# Patient Record
Sex: Female | Born: 1940 | Smoking: Never smoker
Health system: Southern US, Community
[De-identification: ages and names within clinical notes are randomized; demographics above are authoritative.]

## PROBLEM LIST (undated history)

## (undated) DIAGNOSIS — F329 Major depressive disorder, single episode, unspecified: Secondary | ICD-10-CM

## (undated) DIAGNOSIS — F32A Depression, unspecified: Secondary | ICD-10-CM

## (undated) DIAGNOSIS — M1990 Unspecified osteoarthritis, unspecified site: Secondary | ICD-10-CM

## (undated) HISTORY — DX: Major depressive disorder, single episode, unspecified: F32.9

## (undated) HISTORY — DX: Unspecified osteoarthritis, unspecified site: M19.90

## (undated) HISTORY — DX: Depression, unspecified: F32.A

## (undated) HISTORY — PX: DILATION AND CURETTAGE OF UTERUS: SHX78

## (undated) HISTORY — PX: TONSILLECTOMY: SUR1361

---

## 2010-10-13 ENCOUNTER — Encounter (INDEPENDENT_AMBULATORY_CARE_PROVIDER_SITE_OTHER): Payer: Self-pay | Admitting: *Deleted

## 2010-10-23 NOTE — Letter (Signed)
Summary: New Patient letter  [facility]  [address] [place], [place] [address]   Phone: [phone]  Fax: [phone]       10/13/2010 MRN: [id number]  Claire Rodriguez [address] [place], [place]  [address]  Dear Ms. Aboud,  Welcome to the Gastroenterology Division at [facility].    You are scheduled to see Dr.  Christella Hartigan on 11-17-10 at 11:00A.M. on the 3rd floor at [facility], [address]  We ask that you try to arrive at our office 15 minutes prior to your appointment time to allow for check-in.  We would like you to complete the enclosed self-administered evaluation form prior to your visit and bring it with you on the day of your appointment.  We will review it with you.  Also, please bring a complete list of all your medications or, if you prefer, bring the medication bottles and we will list them.  Please bring your insurance card so that we may make a copy of it.  If your insurance requires a referral to see a specialist, please bring your referral form from your primary care physician.  Co-payments are due at the time of your visit and may be paid by cash, check or credit card.     Your office visit will consist of a consult with your physician (includes a physical exam), any laboratory testing he/she may order, scheduling of any necessary diagnostic testing (e.g. x-ray, ultrasound, CT-scan), and scheduling of a procedure (e.g. Endoscopy, Colonoscopy) if required.  Please allow enough time on your schedule to allow for any/all of these possibilities.    If you cannot keep your appointment, please call [phone] to cancel or reschedule prior to your appointment date.  This allows [place] the opportunity to schedule an appointment for another patient in need of care.  If you do not cancel or reschedule by 5 p.m. the business day prior to your appointment date, you will be charged a $50.00 late cancellation/no-show fee.    Thank you for choosing  [facility] for your medical needs.  We appreciate the opportunity to care for you.  Please visit [place] at our website  to learn more about our practice.                     Sincerely,                                                             The Gastroenterology Division

## 2010-11-11 ENCOUNTER — Telehealth (INDEPENDENT_AMBULATORY_CARE_PROVIDER_SITE_OTHER): Payer: Self-pay | Admitting: *Deleted

## 2010-11-17 ENCOUNTER — Other Ambulatory Visit: Payer: Medicare Other

## 2010-11-17 ENCOUNTER — Encounter: Payer: Self-pay | Admitting: Gastroenterology

## 2010-11-17 ENCOUNTER — Ambulatory Visit (INDEPENDENT_AMBULATORY_CARE_PROVIDER_SITE_OTHER): Payer: Medicare Other | Admitting: Gastroenterology

## 2010-11-17 ENCOUNTER — Encounter (INDEPENDENT_AMBULATORY_CARE_PROVIDER_SITE_OTHER): Payer: Self-pay | Admitting: *Deleted

## 2010-11-17 ENCOUNTER — Other Ambulatory Visit: Payer: Self-pay | Admitting: Gastroenterology

## 2010-11-17 DIAGNOSIS — R142 Eructation: Secondary | ICD-10-CM | POA: Insufficient documentation

## 2010-11-17 DIAGNOSIS — R197 Diarrhea, unspecified: Secondary | ICD-10-CM

## 2010-11-17 DIAGNOSIS — R141 Gas pain: Secondary | ICD-10-CM

## 2010-11-17 DIAGNOSIS — R143 Flatulence: Secondary | ICD-10-CM | POA: Insufficient documentation

## 2010-11-18 NOTE — Progress Notes (Signed)
Summary: [place] records  Phone Note Outgoing Call Call back at [facility] Phone [phone]   Call placed by: Chales Abrahams CMA Duncan Dull),  November 11, 2010 2:09 PM Summary of Call: placed a call to the pt she is to have records from [place] [facility] office faxed to [place] Initial call taken by: Chales Abrahams CMA Duncan Dull),  November 11, 2010 2:09 PM

## 2010-11-20 LAB — CONVERTED CEMR LAB: Tissue Transglutaminase Ab, IgA: 7.9 units (ref <20)

## 2010-11-25 NOTE — Letter (Signed)
Summary: [facility] Instructions  [facility]  [address] [place], [place] [address]   Phone: [phone]  Fax: [phone]       Claire Rodriguez    10-26-1940    MRN: [id number]        Procedure Day /Date:12/26/10 FRI     Arrival Time:10 am     Procedure Time:11 am     Location of Procedure:                    X  [facility] (4th Floor)                        PREPARATION FOR COLONOSCOPY WITH MOVIPREP   Starting 5 days prior to your procedure 12/21/10 do not eat nuts, seeds, popcorn, corn, beans, peas,  salads, or any raw vegetables.  Do not take any fiber supplements (e.g. Metamucil, Citrucel, and Benefiber).  THE DAY BEFORE YOUR PROCEDURE         DATE: 12/25/10 DAY: THURS  1.  Drink clear liquids the entire day-NO SOLID FOOD  2.  Do not drink anything colored red or purple.  Avoid juices with pulp.  No orange juice.  3.  Drink at least 64 oz. (8 glasses) of fluid/clear liquids during the day to prevent dehydration and help the prep work efficiently.  CLEAR LIQUIDS INCLUDE: Water Jello Ice Popsicles Tea (sugar ok, no milk/cream) Powdered fruit flavored drinks Coffee (sugar ok, no milk/cream) Gatorade Juice: apple, white grape, white cranberry  Lemonade Clear bullion, consomm, broth Carbonated beverages (any kind) Strained chicken noodle soup Hard Candy                             4.  In the morning, mix first dose of MoviPrep solution:    Empty 1 Pouch A and 1 Pouch B into the disposable container    Add lukewarm drinking water to the top line of the container. Mix to dissolve    Refrigerate (mixed solution should be used within 24 hrs)  5.  Begin drinking the prep at 5:00 p.m. The MoviPrep container is divided by 4 marks.   Every 15 minutes drink the solution down to the next mark (approximately 8 oz) until the full liter is complete.   6.  Follow completed prep with 16 oz of clear liquid of your choice (Nothing red or purple).   Continue to drink clear liquids until bedtime.  7.  Before going to bed, mix second dose of MoviPrep solution:    Empty 1 Pouch A and 1 Pouch B into the disposable container    Add lukewarm drinking water to the top line of the container. Mix to dissolve    Refrigerate  THE DAY OF YOUR PROCEDURE      DATE: 12/26/10 DAY:FRI  Beginning at 6 a.m. (5 hours before procedure):         1. Every 15 minutes, drink the solution down to the next mark (approx 8 oz) until the full liter is complete.  2. Follow completed prep with 16 oz. of clear liquid of your choice.    3. You may drink clear liquids until 9 am (2 HOURS BEFORE PROCEDURE).   MEDICATION INSTRUCTIONS  Unless otherwise instructed, you should take regular prescription medications with a small sip of water   as early as possible the morning of your procedure.  OTHER INSTRUCTIONS  You will need a responsible adult at least 69 years of age to accompany you and drive you home.   This person must remain in the waiting room during your procedure.  Wear loose fitting clothing that is easily removed.  Leave jewelry and other valuables at home.  However, you may wish to bring a book to read or  an iPod/MP3 player to listen to music as you wait for your procedure to start.  Remove all body piercing jewelry and leave at home.  Total time from sign-in until discharge is approximately 2-3 hours.  You should go home directly after your procedure and rest.  You can resume normal activities the  day after your procedure.  The day of your procedure you should not:   Drive   Make legal decisions   Operate machinery   Drink alcohol   Return to work  You will receive specific instructions about eating, activities and medications before you leave.    The above instructions have been reviewed and explained to me by   _______________________    I fully understand and can verbalize these instructions  _____________________________ Date _________

## 2010-11-25 NOTE — Assessment & Plan Note (Signed)
History of Present Illness Visit Type: Initial Consult Primary GI MD: Rob Bunting MD Primary Provider: Charlesetta Shanks, MD Requesting Provider: Charlesetta Shanks, MD Chief Complaint: Belching, gas pain& fecal incontinence History of Present Illness:        very pleasant 70 year old woman  who has UGI< lower GI symptoms for a long time.  urgency for 6-7 years.   She has rectal discomfort.Never sees rectal blood.  Never has nocturnal symptoms.  She has a BM up to 3 or more times a day.  Occasionally stool incontinence.  HAs a lot of gas, flatulence. Can be painful .  She has right leg, hip pains that are usually iimproved if she belches or passes gas.  was taking gas ex two times a day.  She recently moved here and the  gas symptoms are improving.  Urinating can cause a BM as well.  Just moved from [place] to be closer to family.   she had a colonoscopy January 2007 in [place], I reviewed the report and it was completely normal. She had this done for screening.   CBC, complete metabolic profile 1-2 months ago were completely normal.               Current Medications (verified): 1)  Buproban 150 Mg Xr12h-Tab (Bupropion Hcl (Smoking Deter)) .Marland Kitchen.. 1 By Mouth Once Daily 2)  Fluoxetine Hcl 20 Mg Caps (Fluoxetine Hcl) .Marland Kitchen.. 1 By Mouth Once Daily 3)  Tylenol Arthritis Pain 650 Mg Cr-Tabs (Acetaminophen) .... As Needed  Allergies (verified): 1)  ! Penicillin 2)  ! Codeine  Past History:  Past Medical History: Arthritis Depressio  Past Surgical History: Tonsillectomy    Family History:  no colon cancer  Social History:  she is divorced, she has one daughter, she is a retired Lawyer, she does not smoke cigarettes, she rarely drinks alcohol.  Review of Systems       Pertinent positive and negative review of systems were noted in the above HPI and GI specific review of systems.  All other review of systems was otherwise negative.   Vital Signs:  Patient  profile:   70 year old female Height:      67 inches Weight:      140.13 pounds BMI:     22.03 Pulse rate:   64 / minute Pulse rhythm:   regular BP sitting:   110 / 62  (left arm) Cuff size:   regular  Vitals Entered By: June McMurray CMA Duncan Dull) (November 17, 2010 11:00 AM)  Physical Exam  Additional Exam:  Constitutional: generally well appearing Psychiatric: alert and oriented times 3 Eyes: extraocular movements intact Mouth: oropharynx moist, no lesions Neck: supple, no lymphadenopathy Cardiovascular: heart regular rate and rythm Lungs: CTA bilaterally Abdomen: soft, non-tender, non-distended, no obvious ascites, no peritoneal signs, normal bowel sounds Extremities: no lower extremity edema bilaterally Skin: no lesions on visible extremities    Impression & Recommendations:  Problem # 1:   Urgency, intermittent fecal incontinence, gas discomforts  we should repeat colonoscopy, her last one was about 5 years ago and that was prior to all these changes in her bowel habits. she will begin taking Imodium one pill once daily and we'll get celiac sprue serologies tested.  Other Orders: T-Sprue Panel (Celiac Disease Aby Eval) (83516x3/[address]) TLB-IgA (Immunoglobulin A) (82784-IGA)  Patient Instructions: 1)  You will be scheduled to have a colonoscopy. 2)  Take one immodium every day shortly after waking up. 3)  A copy of this information  will be sent to Dr. Celene Skeen. 4)  You will get lab test(s) done today (celiac panel, total IgA level). 5)  The medication list was reviewed and reconciled.  All changed / newly prescribed medications were explained.  A complete medication list was provided to the patient / caregiver.  Appended Document: Orders Update/Movi    Clinical Lists Changes  Medications: Added new medication of MOVIPREP 100 GM  SOLR (PEG-KCL-NACL-NASULF-NA ASC-C) As per prep instructions. - Signed Rx of MOVIPREP 100 GM  SOLR (PEG-KCL-NACL-NASULF-NA ASC-C) As per  prep instructions.;  #1 x 0;  Signed;  Entered by: Chales Abrahams CMA (AAMA);  Authorized by: Rachael Fee MD;  Method used: Electronically to [id number] [address] [place], [place], [place]  [address], Ph: [phone], Fax: [phone] Orders: Added new Test order of Colonoscopy (Colon) - Signed    Prescriptions: MOVIPREP 100 GM  SOLR (PEG-KCL-NACL-NASULF-NA ASC-C) As per prep instructions.  #1 x 0   Entered by:   Chales Abrahams CMA (AAMA)   Authorized by:   Rachael Fee MD   Signed by:   Chales Abrahams CMA (AAMA) on 11/17/2010   Method used:   Electronically to        [id number] (retail)       [address] [place]       [place], [place]  [address]       Ph: [phone]       Fax: [phone]   RxID:   [id number]

## 2010-11-28 ENCOUNTER — Telehealth: Payer: Self-pay | Admitting: Gastroenterology

## 2010-11-28 NOTE — Telephone Encounter (Signed)
Pt had several questions regarding her procedures and the prep.  All her questions have been answered. She will call with any other concerns.  She is very anxious about the procedure and just needed to be reassured.

## 2010-12-05 ENCOUNTER — Telehealth: Payer: Self-pay | Admitting: Gastroenterology

## 2010-12-05 NOTE — Telephone Encounter (Addendum)
Pt has not tried the imodium Dr Christella Hartigan recommended not sure why he prescribed the medication because she does not have diarrhea.  I did explain that the pt was recommended to try the Imodium for stool incontinence.  She did remember then that is why he told her to take it.  She asked if she needed to take it if her symptoms have resolved.  I did advise her that she only needs to take it as needed.  Pt agreed

## 2010-12-26 ENCOUNTER — Encounter: Payer: Medicare Other | Admitting: Gastroenterology

## 2011-01-14 ENCOUNTER — Encounter: Payer: Medicare Other | Admitting: Gastroenterology

## 2011-01-23 ENCOUNTER — Encounter: Payer: Medicare Other | Admitting: Gastroenterology

## 2011-02-13 ENCOUNTER — Encounter: Payer: Medicare Other | Admitting: Gastroenterology

## 2011-03-04 ENCOUNTER — Encounter: Payer: Medicare Other | Admitting: Gastroenterology

## 2011-03-25 ENCOUNTER — Telehealth: Payer: Self-pay | Admitting: *Deleted

## 2011-03-25 ENCOUNTER — Telehealth: Payer: Self-pay | Admitting: Gastroenterology

## 2011-03-25 NOTE — Telephone Encounter (Signed)
Pt. Not available.

## 2011-03-27 NOTE — Telephone Encounter (Signed)
Left message on machine to call back  

## 2011-03-27 NOTE — Telephone Encounter (Signed)
Pt had questions regarding advance directives and the temperature that the Movi prep should be stored at.  I explained the advance directives to her and that the Movi prep is fine as long as she does not mix the solution until the morning before her procedure.  Pt agreed and will call back with any questions

## 2011-04-03 ENCOUNTER — Encounter: Payer: Medicare Other | Admitting: Gastroenterology

## 2011-08-05 ENCOUNTER — Encounter: Payer: Medicare Other | Admitting: Gastroenterology

## 2011-08-14 ENCOUNTER — Telehealth: Payer: Self-pay | Admitting: Gastroenterology

## 2011-08-17 NOTE — Telephone Encounter (Signed)
Left message on machine to call back  

## 2011-08-18 NOTE — Telephone Encounter (Signed)
Left message on machine to call back letter mailed. 

## 2011-08-20 ENCOUNTER — Telehealth: Payer: Self-pay | Admitting: Gastroenterology

## 2011-08-20 NOTE — Telephone Encounter (Signed)
Dr Christella Hartigan the pt is calling to reschedule her Endo Colon that she has cx many times in the past.  She says she was told that if she did not keep the  last  Appointment she could not reschedule.  I do not see that documentation in her chart.  She was last seen in March of this year,  Can she reschedule or do you want to see her in the office?

## 2011-08-21 NOTE — Telephone Encounter (Signed)
Pt was given 09/15/11 for a return visit to discuss endo colon she is aware

## 2011-08-21 NOTE — Telephone Encounter (Signed)
She needs rov before scheduling another time for colonoscopy

## 2011-09-15 ENCOUNTER — Encounter: Payer: Self-pay | Admitting: Gastroenterology

## 2011-09-15 ENCOUNTER — Ambulatory Visit (INDEPENDENT_AMBULATORY_CARE_PROVIDER_SITE_OTHER): Payer: Medicare Other | Admitting: Gastroenterology

## 2011-09-15 VITALS — BP 112/70 | HR 52 | Ht 67.0 in | Wt 145.0 lb

## 2011-09-15 DIAGNOSIS — R194 Change in bowel habit: Secondary | ICD-10-CM

## 2011-09-15 DIAGNOSIS — R1013 Epigastric pain: Secondary | ICD-10-CM

## 2011-09-15 DIAGNOSIS — R198 Other specified symptoms and signs involving the digestive system and abdomen: Secondary | ICD-10-CM

## 2011-09-15 MED ORDER — PEG-KCL-NACL-NASULF-NA ASC-C 100 G PO SOLR: 1.0000 | ORAL | Status: DC

## 2011-09-15 NOTE — Patient Instructions (Addendum)
You will be set up for a colonoscopy abnormal bowel habits. You will be set up for an upper endoscopy for ?celiac sprue. Please start taking citrucel (orange flavored) powder fiber supplement.  This may cause some bloating at first but that usually goes away. Begin with a small spoonful and work your way up to a large, heaping spoonful daily over a week.

## 2011-09-15 NOTE — Progress Notes (Signed)
HPI: This is a very pleasant 71 year old woman whom I last saw about 8 or 9 months ago.  She has belching, bloating, intermittent loose stools.  She is very concerned with upset stomach, usually has 3-4-5 bms (several pellets, not loose).   Has not tried fiber supplements in past.    TEXT FROM 3/12 office visit: "urgency for 6-7 years. She has rectal discomfort.Never sees rectal blood. Never has nocturnal symptoms. She has a BM up to 3 or more times a day. Occasionally stool incontinence.  HAs a lot of gas, flatulence. Can be painful . She has right leg, hip pains that are usually iimproved if she belches or passes gas. was taking gas ex two times a day. She recently moved here and the gas symptoms are improving.  Urinating can cause a BM as well.  Just moved from [place] to be closer to family.  she had a colonoscopy January 2007 in [place], I reviewed the report and it was completely normal. She had this done for screening.  CBC, complete metabolic profile 1-2 months ago were completely normal."  Celiac sprue serologies were drawn after his visits, one out of 3 of the tests was equivocal. She was recommended to have colonoscopy and upper endoscopy but canceled several times. She was also recommended to try Imodium on a daily basis.  Past Medical History  Diagnosis Date  . Arthritis   . Depression     Past Surgical History  Procedure Date  . Tonsillectomy   . Dilation and curettage of uterus     Current Outpatient Prescriptions  Medication Sig Dispense Refill  . buPROPion (WELLBUTRIN SR) 150 MG 12 hr tablet Take 150 mg by mouth 2 (two) times daily.        . Clobetasol Propionate (CLOBETASOL 17 PROPIONATE) 0.5 % POWD by Does not apply route.        Marland Kitchen desonide (DESOWEN) 0.05 % cream Apply topically 2 (two) times daily.        Marland Kitchen FLUoxetine (PROZAC) 20 MG capsule Take 20 mg by mouth daily.          Allergies as of 09/15/2011 - Review Complete 09/15/2011  Allergen Reaction  Noted  . Codeine    . Penicillins      Family History  Problem Relation Age of Onset  . Colon cancer Neg Hx     History   Social History  . Marital Status: Unknown    Spouse Name: N/A    Number of Children: 1  . Years of Education: N/A   Occupational History  . retired     Lawyer   Social History Main Topics  . Smoking status: Never Smoker   . Smokeless tobacco: Never Used  . Alcohol Use: No  . Drug Use: No  . Sexually Active: Not on file   Other Topics Concern  . Not on file   Social History Narrative  . No narrative on file      Physical Exam: BP 112/70  Pulse 52  Ht 5\' 7"  (1.702 m)  Wt 145 lb (65.772 kg)  BMI 22.71 kg/m2  SpO2 99% Constitutional: generally well-appearing Psychiatric: alert and oriented x3 Abdomen: soft, nontender, nondistended, no obvious ascites, no peritoneal signs, normal bowel sounds     Assessment and plan: 71 y.o. female with equivocal celiac sprue blood testing, change in her bowel habits since her last colonoscopy 6 years ago  We will proceed with colonoscopy and upper endoscopy at her since  convenience. I recommended she try fiber supplement on a daily basis to help with her scibbolous type stools

## 2011-09-22 ENCOUNTER — Telehealth: Payer: Self-pay | Admitting: Gastroenterology

## 2011-09-22 NOTE — Telephone Encounter (Signed)
I answered all her questions and she wants to proceed with both colonoscopy and upper endoscopy.

## 2011-09-22 NOTE — Telephone Encounter (Signed)
Pt has questions regarding her EGD she has read some info and is worried about complications.  I explained the procedure and answered her questions, she would still like for Dr Christella Hartigan to call her so that she may discuss it further.

## 2011-09-29 ENCOUNTER — Ambulatory Visit (AMBULATORY_SURGERY_CENTER): Payer: Medicare Other | Admitting: Gastroenterology

## 2011-09-29 ENCOUNTER — Encounter: Payer: Self-pay | Admitting: Gastroenterology

## 2011-09-29 DIAGNOSIS — R198 Other specified symptoms and signs involving the digestive system and abdomen: Secondary | ICD-10-CM

## 2011-09-29 DIAGNOSIS — K3189 Other diseases of stomach and duodenum: Secondary | ICD-10-CM

## 2011-09-29 DIAGNOSIS — D126 Benign neoplasm of colon, unspecified: Secondary | ICD-10-CM

## 2011-09-29 MED ORDER — SODIUM CHLORIDE 0.9 % IV SOLN: 500.0000 mL | INTRAVENOUS | Status: DC

## 2011-09-29 NOTE — Op Note (Signed)
[facility] [address] [place], [place]  [address]  ENDOSCOPY PROCEDURE REPORT  PATIENT:  Claire Rodriguez, Claire Rodriguez  MR#:  [id number] BIRTHDATE:  [birth year], 70 yrs. old  GENDER:  female ENDOSCOPIST:  Rachael Fee, MD Referred by:  Tracey Harries, M.D. PROCEDURE DATE:  09/29/2011 PROCEDURE:  EGD with biopsy, 43239 ASA CLASS:  Class II INDICATIONS:  dyspepsia, one out of three celiac sprue serologies was slightly elevated MEDICATIONS:  There was residual sedation effect present from prior procedure., These medications were titrated to patient response per physician's verbal order, Versed 1 mg IV TOPICAL ANESTHETIC:  Cetacaine Spray  DESCRIPTION OF PROCEDURE:   After the risks benefits and alternatives of the procedure were thoroughly explained, informed consent was obtained.  The LB [id number]-H180 [id number] endoscope was introduced through the mouth and advanced to the second portion of the duodenum, without limitations.  The instrument was slowly withdrawn as the mucosa was fully examined. <<PROCEDUREIMAGES>> The upper, middle, and distal third of the esophagus were carefully inspected and no abnormalities were noted. The z-line was well seen at the GEJ. The endoscope was pushed into the fundus which was normal including a retroflexed view. The antrum,gastric body, first and second part of the duodenum were unremarkable. Biopsies were taken from normal duodenum and sent to pathology (see image1, image2, image3, and image4).    Retroflexed views revealed no abnormalities.    The scope was then withdrawn from the patient and the procedure completed. COMPLICATIONS:  None  ENDOSCOPIC IMPRESSION: 1) Normal EGD, duodenum biopsied to check for Celiac Sprue  RECOMMENDATIONS: 1) Await biopsy results  ______________________________ Rachael Fee, MD  n. eSIGNED:   Rachael Fee at 09/29/2011 02:15 PM  Candee Furbish, [id number]

## 2011-09-29 NOTE — Patient Instructions (Signed)
Discharge instructions given with verbal understanding. Handout on polyps given. Resume previous medications. 

## 2011-09-29 NOTE — Op Note (Signed)
[facility] [address] [place], [place]  [address]  COLONOSCOPY PROCEDURE REPORT  PATIENT:  Claire Rodriguez, Claire Rodriguez  MR#:  [id number] BIRTHDATE:  04/02/1941, 70 yrs. old  GENDER:  female ENDOSCOPIST:  Rachael Fee, MD REF. BY:  Tracey Harries, M.D. PROCEDURE DATE:  09/29/2011 PROCEDURE:  Colonoscopy with biopsy ASA CLASS:  Class II INDICATIONS:  change in bowel habits, last colonoscopy 6-7 years ago MEDICATIONS:   Fentanyl 50 mcg IV, These medications were titrated to patient response per physician's verbal order, Versed 5 mg IV  DESCRIPTION OF PROCEDURE:   After the risks benefits and alternatives of the procedure were thoroughly explained, informed consent was obtained.  Digital rectal exam was performed and revealed no rectal masses.   The LB [id number] endoscope was introduced through the anus and advanced to the cecum, which was identified by both the appendix and ileocecal valve, without limitations.  The quality of the prep was good..  The instrument was then slowly withdrawn as the colon was fully examined. <<PROCEDUREIMAGES>> FINDINGS:  A diminutive polyp was found in the sigmoid colon. This was removed with forceps and sent to pathology (jar 1) (see image3 and image5).  This was otherwise a normal examination of the colon (see image6, image1, and image2).   Retroflexed views in the rectum revealed no abnormalities. COMPLICATIONS:  None  ENDOSCOPIC IMPRESSION: 1) Diminutive polyp in the sigmoid colon, removed and sent to pathology 2) Otherwise normal examination  RECOMMENDATIONS: 1) If the polyp(s) removed today are proven to be adenomatous (pre-cancerous) polyps, you will need a repeat colonoscopy in 5 years. Otherwise you should continue to follow colorectal cancer screening guidelines for "routine risk" patients with colonoscopy in 10 years.  You will get a letter in the mail in 2-3 weeks. 2) Please start once daily fiber supplements as we  discussed at office visit earlier this month.  ______________________________ Rachael Fee, MD  n. eSIGNED:   Rachael Fee at 09/29/2011 02:07 PM  Candee Furbish, [phone]

## 2011-09-29 NOTE — Progress Notes (Signed)
Patient did not experience any of the following events: a burn prior to discharge; a fall within the facility; wrong site/side/patient/procedure/implant event; or a hospital transfer or hospital admission upon discharge from the facility. (G8907) Patient did not have preoperative order for IV antibiotic SSI prophylaxis. (G8918)  

## 2011-09-30 ENCOUNTER — Telehealth: Payer: Self-pay | Admitting: *Deleted

## 2011-09-30 NOTE — Telephone Encounter (Signed)

## 2011-10-01 ENCOUNTER — Telehealth: Payer: Self-pay | Admitting: Gastroenterology

## 2011-10-02 NOTE — Telephone Encounter (Signed)
Pt wants to try the store brand of the citrucel because of cost issues.  I did advise that it was ok to use store brand. She will call if she has further questions or concerns

## 2011-10-06 ENCOUNTER — Encounter: Payer: Self-pay | Admitting: Gastroenterology

## 2011-10-06 ENCOUNTER — Telehealth: Payer: Self-pay | Admitting: Gastroenterology

## 2011-10-06 NOTE — Telephone Encounter (Signed)
i agree, thanks 

## 2011-10-06 NOTE — Telephone Encounter (Signed)
Pt has been having gas and bloating and right side abd pain she has not started the citrucel as advised.  She says she is starting the Citrucel today.  The gas and bloating are worse after the colonoscopy.  She is passing gas and having bowel movments, the stools are small and she goes 3-4 times per day.  This is nothing new for her.

## 2013-09-20 DIAGNOSIS — L219 Seborrheic dermatitis, unspecified: Secondary | ICD-10-CM | POA: Diagnosis not present

## 2013-09-30 DIAGNOSIS — B029 Zoster without complications: Secondary | ICD-10-CM | POA: Diagnosis not present

## 2013-10-09 DIAGNOSIS — B029 Zoster without complications: Secondary | ICD-10-CM | POA: Diagnosis not present

## 2013-10-09 DIAGNOSIS — Z87891 Personal history of nicotine dependence: Secondary | ICD-10-CM | POA: Diagnosis not present

## 2013-10-09 DIAGNOSIS — IMO0002 Reserved for concepts with insufficient information to code with codable children: Secondary | ICD-10-CM | POA: Diagnosis not present

## 2013-10-20 DIAGNOSIS — B029 Zoster without complications: Secondary | ICD-10-CM | POA: Diagnosis not present

## 2013-10-20 DIAGNOSIS — H579 Unspecified disorder of eye and adnexa: Secondary | ICD-10-CM | POA: Diagnosis not present

## 2013-10-20 DIAGNOSIS — Z87891 Personal history of nicotine dependence: Secondary | ICD-10-CM | POA: Diagnosis not present

## 2013-10-20 DIAGNOSIS — IMO0002 Reserved for concepts with insufficient information to code with codable children: Secondary | ICD-10-CM | POA: Diagnosis not present

## 2014-01-22 DIAGNOSIS — Z780 Asymptomatic menopausal state: Secondary | ICD-10-CM | POA: Diagnosis not present

## 2014-01-22 DIAGNOSIS — Z0000 Encounter for general adult medical examination without abnormal findings: Secondary | ICD-10-CM | POA: Diagnosis not present

## 2014-01-22 DIAGNOSIS — Z1231 Encounter for screening mammogram for malignant neoplasm of breast: Secondary | ICD-10-CM | POA: Diagnosis not present

## 2014-03-28 DIAGNOSIS — M25559 Pain in unspecified hip: Secondary | ICD-10-CM | POA: Diagnosis not present

## 2014-03-28 DIAGNOSIS — R635 Abnormal weight gain: Secondary | ICD-10-CM | POA: Diagnosis not present

## 2014-03-28 DIAGNOSIS — M2550 Pain in unspecified joint: Secondary | ICD-10-CM | POA: Diagnosis not present

## 2014-03-28 DIAGNOSIS — M21619 Bunion of unspecified foot: Secondary | ICD-10-CM | POA: Diagnosis not present

## 2014-04-02 DIAGNOSIS — M25559 Pain in unspecified hip: Secondary | ICD-10-CM | POA: Diagnosis not present

## 2014-05-09 DIAGNOSIS — M2550 Pain in unspecified joint: Secondary | ICD-10-CM | POA: Diagnosis not present

## 2014-05-09 DIAGNOSIS — L821 Other seborrheic keratosis: Secondary | ICD-10-CM | POA: Diagnosis not present

## 2014-05-09 DIAGNOSIS — L219 Seborrheic dermatitis, unspecified: Secondary | ICD-10-CM | POA: Diagnosis not present

## 2014-05-23 DIAGNOSIS — L299 Pruritus, unspecified: Secondary | ICD-10-CM | POA: Diagnosis not present

## 2014-05-23 DIAGNOSIS — L219 Seborrheic dermatitis, unspecified: Secondary | ICD-10-CM | POA: Diagnosis not present

## 2014-05-24 DIAGNOSIS — L290 Pruritus ani: Secondary | ICD-10-CM | POA: Diagnosis not present

## 2014-06-15 DIAGNOSIS — M2042 Other hammer toe(s) (acquired), left foot: Secondary | ICD-10-CM | POA: Diagnosis not present

## 2014-06-15 DIAGNOSIS — M2041 Other hammer toe(s) (acquired), right foot: Secondary | ICD-10-CM | POA: Diagnosis not present

## 2014-06-15 DIAGNOSIS — M2012 Hallux valgus (acquired), left foot: Secondary | ICD-10-CM | POA: Diagnosis not present

## 2014-06-15 DIAGNOSIS — L84 Corns and callosities: Secondary | ICD-10-CM | POA: Diagnosis not present

## 2014-06-15 DIAGNOSIS — M2011 Hallux valgus (acquired), right foot: Secondary | ICD-10-CM | POA: Diagnosis not present

## 2014-06-15 DIAGNOSIS — M722 Plantar fascial fibromatosis: Secondary | ICD-10-CM | POA: Diagnosis not present

## 2014-07-02 DIAGNOSIS — F330 Major depressive disorder, recurrent, mild: Secondary | ICD-10-CM | POA: Diagnosis not present

## 2014-07-02 DIAGNOSIS — Z23 Encounter for immunization: Secondary | ICD-10-CM | POA: Diagnosis not present

## 2014-07-17 DIAGNOSIS — M722 Plantar fascial fibromatosis: Secondary | ICD-10-CM | POA: Diagnosis not present

## 2014-07-17 DIAGNOSIS — M2011 Hallux valgus (acquired), right foot: Secondary | ICD-10-CM | POA: Diagnosis not present

## 2014-08-20 DIAGNOSIS — L218 Other seborrheic dermatitis: Secondary | ICD-10-CM | POA: Diagnosis not present

## 2014-08-20 DIAGNOSIS — L299 Pruritus, unspecified: Secondary | ICD-10-CM | POA: Diagnosis not present

## 2014-08-20 DIAGNOSIS — L219 Seborrheic dermatitis, unspecified: Secondary | ICD-10-CM | POA: Diagnosis not present

## 2014-08-21 DIAGNOSIS — E039 Hypothyroidism, unspecified: Secondary | ICD-10-CM | POA: Diagnosis not present

## 2014-08-21 DIAGNOSIS — E162 Hypoglycemia, unspecified: Secondary | ICD-10-CM | POA: Diagnosis not present

## 2014-08-21 DIAGNOSIS — L680 Hirsutism: Secondary | ICD-10-CM | POA: Diagnosis not present

## 2014-08-22 ENCOUNTER — Other Ambulatory Visit (HOSPITAL_COMMUNITY): Payer: Self-pay | Admitting: Internal Medicine

## 2014-08-22 DIAGNOSIS — R42 Dizziness and giddiness: Secondary | ICD-10-CM | POA: Diagnosis not present

## 2014-08-22 DIAGNOSIS — Z0000 Encounter for general adult medical examination without abnormal findings: Secondary | ICD-10-CM | POA: Diagnosis not present

## 2014-08-22 DIAGNOSIS — H8309 Labyrinthitis, unspecified ear: Secondary | ICD-10-CM | POA: Diagnosis not present

## 2014-08-22 DIAGNOSIS — R1032 Left lower quadrant pain: Secondary | ICD-10-CM | POA: Diagnosis not present

## 2014-08-22 DIAGNOSIS — H9113 Presbycusis, bilateral: Secondary | ICD-10-CM | POA: Diagnosis not present

## 2014-08-22 DIAGNOSIS — Z1389 Encounter for screening for other disorder: Secondary | ICD-10-CM | POA: Diagnosis not present

## 2014-08-22 DIAGNOSIS — R002 Palpitations: Secondary | ICD-10-CM | POA: Diagnosis not present

## 2014-08-22 DIAGNOSIS — E780 Pure hypercholesterolemia: Secondary | ICD-10-CM | POA: Diagnosis not present

## 2014-08-22 DIAGNOSIS — I70219 Atherosclerosis of native arteries of extremities with intermittent claudication, unspecified extremity: Secondary | ICD-10-CM | POA: Diagnosis not present

## 2014-08-22 DIAGNOSIS — R0989 Other specified symptoms and signs involving the circulatory and respiratory systems: Secondary | ICD-10-CM

## 2014-08-27 ENCOUNTER — Ambulatory Visit (HOSPITAL_COMMUNITY)
Admission: RE | Admit: 2014-08-27 | Discharge: 2014-08-27 | Disposition: A | Discharge status: Home / Self Care | Payer: Medicare Other | Source: Ambulatory Visit | Attending: Internal Medicine | Admitting: Internal Medicine

## 2014-08-27 DIAGNOSIS — R0989 Other specified symptoms and signs involving the circulatory and respiratory systems: Secondary | ICD-10-CM

## 2014-08-27 DIAGNOSIS — R5383 Other fatigue: Secondary | ICD-10-CM | POA: Diagnosis not present

## 2014-08-27 DIAGNOSIS — Z79899 Other long term (current) drug therapy: Secondary | ICD-10-CM | POA: Diagnosis not present

## 2014-08-27 DIAGNOSIS — R412 Retrograde amnesia: Secondary | ICD-10-CM | POA: Diagnosis not present

## 2014-08-27 DIAGNOSIS — E780 Pure hypercholesterolemia: Secondary | ICD-10-CM | POA: Diagnosis not present

## 2014-08-27 DIAGNOSIS — I6523 Occlusion and stenosis of bilateral carotid arteries: Secondary | ICD-10-CM | POA: Insufficient documentation

## 2014-08-27 NOTE — Progress Notes (Signed)
VASCULAR LAB PRELIMINARY  PRELIMINARY  PRELIMINARY  PRELIMINARY  Carotid Dopplers completed.    Preliminary report:  1-39% ICA stenosis.  Vertebral artery flow is antegrade.  Alberto Schoch, RVT 08/27/2014, 12:01 PM

## 2014-10-29 ENCOUNTER — Encounter: Payer: Self-pay | Admitting: Internal Medicine

## 2014-11-16 DIAGNOSIS — E039 Hypothyroidism, unspecified: Secondary | ICD-10-CM | POA: Diagnosis not present

## 2014-11-16 DIAGNOSIS — L680 Hirsutism: Secondary | ICD-10-CM | POA: Diagnosis not present

## 2014-12-05 DIAGNOSIS — I10 Essential (primary) hypertension: Secondary | ICD-10-CM | POA: Diagnosis not present

## 2014-12-06 DIAGNOSIS — L680 Hirsutism: Secondary | ICD-10-CM | POA: Diagnosis not present

## 2014-12-06 DIAGNOSIS — R7301 Impaired fasting glucose: Secondary | ICD-10-CM | POA: Diagnosis not present

## 2015-02-19 DIAGNOSIS — L219 Seborrheic dermatitis, unspecified: Secondary | ICD-10-CM | POA: Diagnosis not present

## 2015-02-19 DIAGNOSIS — R413 Other amnesia: Secondary | ICD-10-CM | POA: Diagnosis not present

## 2015-02-19 DIAGNOSIS — E559 Vitamin D deficiency, unspecified: Secondary | ICD-10-CM | POA: Diagnosis not present

## 2015-02-19 DIAGNOSIS — E119 Type 2 diabetes mellitus without complications: Secondary | ICD-10-CM | POA: Diagnosis not present

## 2015-02-19 DIAGNOSIS — M1990 Unspecified osteoarthritis, unspecified site: Secondary | ICD-10-CM | POA: Diagnosis not present

## 2015-02-19 DIAGNOSIS — R0989 Other specified symptoms and signs involving the circulatory and respiratory systems: Secondary | ICD-10-CM | POA: Diagnosis not present

## 2015-02-19 DIAGNOSIS — M545 Low back pain: Secondary | ICD-10-CM | POA: Diagnosis not present

## 2015-02-19 DIAGNOSIS — F322 Major depressive disorder, single episode, severe without psychotic features: Secondary | ICD-10-CM | POA: Diagnosis not present

## 2015-02-19 DIAGNOSIS — Z79899 Other long term (current) drug therapy: Secondary | ICD-10-CM | POA: Diagnosis not present

## 2015-02-20 DIAGNOSIS — L659 Nonscarring hair loss, unspecified: Secondary | ICD-10-CM | POA: Diagnosis not present

## 2015-02-20 DIAGNOSIS — L299 Pruritus, unspecified: Secondary | ICD-10-CM | POA: Diagnosis not present

## 2015-02-20 DIAGNOSIS — L650 Telogen effluvium: Secondary | ICD-10-CM | POA: Diagnosis not present

## 2015-02-20 DIAGNOSIS — L219 Seborrheic dermatitis, unspecified: Secondary | ICD-10-CM | POA: Diagnosis not present

## 2015-02-26 ENCOUNTER — Other Ambulatory Visit: Payer: Self-pay | Admitting: Family Medicine

## 2015-02-26 DIAGNOSIS — R0989 Other specified symptoms and signs involving the circulatory and respiratory systems: Secondary | ICD-10-CM

## 2015-03-22 DIAGNOSIS — H04129 Dry eye syndrome of unspecified lacrimal gland: Secondary | ICD-10-CM | POA: Diagnosis not present

## 2015-03-22 DIAGNOSIS — B009 Herpesviral infection, unspecified: Secondary | ICD-10-CM | POA: Diagnosis not present

## 2015-03-22 DIAGNOSIS — I6523 Occlusion and stenosis of bilateral carotid arteries: Secondary | ICD-10-CM | POA: Diagnosis not present

## 2015-04-01 DIAGNOSIS — H1013 Acute atopic conjunctivitis, bilateral: Secondary | ICD-10-CM | POA: Diagnosis not present

## 2015-05-24 DIAGNOSIS — Z79899 Other long term (current) drug therapy: Secondary | ICD-10-CM | POA: Diagnosis not present

## 2015-05-24 DIAGNOSIS — F322 Major depressive disorder, single episode, severe without psychotic features: Secondary | ICD-10-CM | POA: Diagnosis not present

## 2015-05-28 DIAGNOSIS — Z79899 Other long term (current) drug therapy: Secondary | ICD-10-CM | POA: Diagnosis not present

## 2015-07-09 DIAGNOSIS — Z79899 Other long term (current) drug therapy: Secondary | ICD-10-CM | POA: Diagnosis not present

## 2015-07-09 DIAGNOSIS — R55 Syncope and collapse: Secondary | ICD-10-CM | POA: Diagnosis not present

## 2015-07-09 DIAGNOSIS — I10 Essential (primary) hypertension: Secondary | ICD-10-CM | POA: Diagnosis not present

## 2015-07-12 ENCOUNTER — Other Ambulatory Visit: Payer: Self-pay | Admitting: Family Medicine

## 2015-07-12 DIAGNOSIS — R55 Syncope and collapse: Secondary | ICD-10-CM

## 2015-07-16 DIAGNOSIS — R55 Syncope and collapse: Secondary | ICD-10-CM | POA: Insufficient documentation

## 2015-07-17 ENCOUNTER — Ambulatory Visit
Admission: RE | Admit: 2015-07-17 | Discharge: 2015-07-17 | Disposition: A | Discharge status: Home / Self Care | Payer: Medicare Other | Source: Ambulatory Visit | Attending: Family Medicine | Admitting: Family Medicine

## 2015-07-17 ENCOUNTER — Ambulatory Visit (INDEPENDENT_AMBULATORY_CARE_PROVIDER_SITE_OTHER): Payer: Medicare Other

## 2015-07-17 DIAGNOSIS — R55 Syncope and collapse: Secondary | ICD-10-CM

## 2015-07-17 DIAGNOSIS — R51 Headache: Secondary | ICD-10-CM | POA: Diagnosis not present

## 2015-07-17 DIAGNOSIS — H532 Diplopia: Secondary | ICD-10-CM | POA: Diagnosis not present

## 2015-07-17 MED ORDER — GADOBENATE DIMEGLUMINE 529 MG/ML IV SOLN
13.0000 mL | Freq: Once | INTRAVENOUS | Status: AC | PRN
  Administered 2015-07-17: 13 mL via INTRAVENOUS

## 2015-07-22 ENCOUNTER — Other Ambulatory Visit: Payer: Medicare Other

## 2015-07-23 ENCOUNTER — Ambulatory Visit (INDEPENDENT_AMBULATORY_CARE_PROVIDER_SITE_OTHER): Payer: Medicare Other | Admitting: Neurology

## 2015-07-23 ENCOUNTER — Encounter: Payer: Self-pay | Admitting: Neurology

## 2015-07-23 VITALS — BP 134/70 | HR 61 | Resp 16 | Ht 66.5 in | Wt 154.0 lb

## 2015-07-23 DIAGNOSIS — R404 Transient alteration of awareness: Secondary | ICD-10-CM | POA: Diagnosis not present

## 2015-07-23 DIAGNOSIS — R413 Other amnesia: Secondary | ICD-10-CM

## 2015-07-23 NOTE — Patient Instructions (Addendum)
1. Schedule routine EEG, then 24-hour EEG 2. Start daily aspirin 81mg  3. We will obtain bloodwork from Dr. Stephanie Acre office, and order additional bloodwork if needed 4. As per [place] driving laws, after an episode of loss of awareness, one should not drive until 6 months event-free 5. Follow-up in 1 month
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
## 2015-07-28 DIAGNOSIS — R413 Other amnesia: Secondary | ICD-10-CM | POA: Insufficient documentation

## 2015-07-28 DIAGNOSIS — R404 Transient alteration of awareness: Secondary | ICD-10-CM | POA: Insufficient documentation

## 2015-07-29 ENCOUNTER — Telehealth: Payer: Self-pay | Admitting: Family Medicine

## 2015-07-29 DIAGNOSIS — R413 Other amnesia: Secondary | ICD-10-CM

## 2015-07-29 NOTE — Telephone Encounter (Signed)
-----   Message from Cameron Sprang, MD sent at 07/28/2015  9:49 AM EST ----- Regarding: pls order B12 Pls let her know labs from Dr. Stephanie Acre reviewed, TSH normal. Would do B12 level as part of memory loss workup. Thanks

## 2015-07-29 NOTE — Telephone Encounter (Signed)
Lmovm to rtn my call. 

## 2015-07-30 NOTE — Telephone Encounter (Signed)
Pt is returning your call

## 2015-07-30 NOTE — Telephone Encounter (Signed)
Patient returned my call. Made her aware of advisement. She is going out of town tomorrow until 12/12. She does have an appt for an eeg on 12/14 she states she will come in a little earlier before her appt to have that lab drawn.

## 2015-08-21 ENCOUNTER — Ambulatory Visit (INDEPENDENT_AMBULATORY_CARE_PROVIDER_SITE_OTHER): Payer: Medicare Other | Admitting: Neurology

## 2015-08-21 ENCOUNTER — Other Ambulatory Visit: Payer: Medicare Other

## 2015-08-21 DIAGNOSIS — R413 Other amnesia: Secondary | ICD-10-CM

## 2015-08-21 DIAGNOSIS — R404 Transient alteration of awareness: Secondary | ICD-10-CM

## 2015-08-21 LAB — VITAMIN B12: Vitamin B-12: >1500 pg/mL — ABNORMAL HIGH (ref 211–911)

## 2015-08-22 ENCOUNTER — Telehealth: Payer: Self-pay | Admitting: Family Medicine

## 2015-08-22 NOTE — Telephone Encounter (Signed)
Left msg on vm to rtn my call.

## 2015-08-22 NOTE — Telephone Encounter (Signed)
Caryl Pina let her know that her b-12 level was good

## 2015-08-22 NOTE — Telephone Encounter (Signed)
-----   Message from Cameron Sprang, MD sent at 08/22/2015  9:44 AM EST ----- Pls let her know B12 level is good. Thanks
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
## 2015-08-26 ENCOUNTER — Telehealth: Payer: Self-pay | Admitting: Family Medicine

## 2015-08-26 NOTE — Telephone Encounter (Signed)
Patient returned my call. Notified her of result. She is scheduled for a 24 hour eeg in January.

## 2015-08-26 NOTE — Telephone Encounter (Signed)
Lmovm to rtn my call. 

## 2015-08-26 NOTE — Telephone Encounter (Signed)
-----   Message from Cameron Sprang, MD sent at 08/23/2015  2:20 PM EST ----- Please let patient know routine EEG is normal, would proceed with 24-hour EEG as discussed. Thanks

## 2015-08-27 ENCOUNTER — Ambulatory Visit: Payer: Medicare Other | Admitting: Neurology

## 2015-09-25 ENCOUNTER — Ambulatory Visit (INDEPENDENT_AMBULATORY_CARE_PROVIDER_SITE_OTHER): Payer: Medicare Other | Admitting: Neurology

## 2015-09-25 DIAGNOSIS — R413 Other amnesia: Secondary | ICD-10-CM | POA: Diagnosis not present

## 2015-09-25 DIAGNOSIS — G40909 Epilepsy, unspecified, not intractable, without status epilepticus: Secondary | ICD-10-CM

## 2015-09-25 DIAGNOSIS — R404 Transient alteration of awareness: Secondary | ICD-10-CM

## 2015-09-26 ENCOUNTER — Encounter: Payer: Self-pay | Admitting: Neurology

## 2015-09-26 NOTE — Progress Notes (Unsigned)
While here for removal of EEG electrodes for 24 hour ambulatory EEG some collodion removal dripped down her forehead and she wiped it and touched her left eye. As soon as she reported this to me I had her stand up turn around and start flushing her eye. She flushed her eye with water for several minutes. I finished removing the electrodes and asked her if she wanted to go to the emergency room and she did not want to inconvenience her ride. She said her eye felt a little irritated but not burning like when she realized some Collodion remover was in it. She has artificial tears and was going to put some in her eye when she got home. Her eye did appear a little irritated pink, not bloodshot. She asked if she could go to the emergency room later if needed I told her yes by all means and that I would place this note in her chart as well as fill out a safety report on the portal which I did before writing this note.

## 2015-10-02 ENCOUNTER — Telehealth: Payer: Self-pay | Admitting: Neurology

## 2015-10-02 NOTE — Telephone Encounter (Signed)
PT called and wanted to know if the EEG results are ready yetDawn CB# [phone]

## 2015-10-02 NOTE — Telephone Encounter (Signed)
Please review
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
## 2015-10-04 ENCOUNTER — Ambulatory Visit (INDEPENDENT_AMBULATORY_CARE_PROVIDER_SITE_OTHER): Payer: Medicare Other | Admitting: Neurology

## 2015-10-04 ENCOUNTER — Encounter: Payer: Self-pay | Admitting: Neurology

## 2015-10-04 VITALS — BP 130/64 | HR 66 | Ht 66.5 in | Wt 158.0 lb

## 2015-10-04 DIAGNOSIS — R413 Other amnesia: Secondary | ICD-10-CM
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
## 2015-10-04 NOTE — Telephone Encounter (Signed)
See clinic visit note
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
## 2015-11-26 ENCOUNTER — Telehealth: Payer: Self-pay | Admitting: Neurology

## 2015-11-26 NOTE — Telephone Encounter (Signed)
Returned call. Patient wanted to make sure her next appt date was correct. She is scheduled for her 3 month f/u on 5/2. The March 28th appt was cancelled because she was seen at the end of Jan.

## 2015-11-26 NOTE — Telephone Encounter (Signed)
Patient called today about 2 appointments that she has written down. We has her scheduled for 3/28 which was cancelled and another appointment scheduled for 5/2. She was unsure why the 3/28 was cancelled. She goes tomorrow 3/23 for testing at [facility]. She would like you to call her back please. Her name is Claire Rodriguez June 27, 2041. Her number is  [phone]. Thank you

## 2015-11-27 DIAGNOSIS — R413 Other amnesia: Secondary | ICD-10-CM | POA: Diagnosis not present

## 2015-11-29 ENCOUNTER — Other Ambulatory Visit (HOSPITAL_BASED_OUTPATIENT_CLINIC_OR_DEPARTMENT_OTHER): Payer: Self-pay | Admitting: Family Medicine

## 2015-11-29 DIAGNOSIS — Z1231 Encounter for screening mammogram for malignant neoplasm of breast: Secondary | ICD-10-CM

## 2015-12-02 ENCOUNTER — Ambulatory Visit (HOSPITAL_BASED_OUTPATIENT_CLINIC_OR_DEPARTMENT_OTHER): Payer: Medicare Other

## 2015-12-03 ENCOUNTER — Ambulatory Visit: Payer: Medicare Other | Admitting: Neurology

## 2015-12-10 DIAGNOSIS — I10 Essential (primary) hypertension: Secondary | ICD-10-CM | POA: Diagnosis not present

## 2015-12-10 DIAGNOSIS — R609 Edema, unspecified: Secondary | ICD-10-CM | POA: Diagnosis not present

## 2015-12-10 DIAGNOSIS — N644 Mastodynia: Secondary | ICD-10-CM | POA: Diagnosis not present

## 2015-12-10 DIAGNOSIS — R002 Palpitations: Secondary | ICD-10-CM | POA: Diagnosis not present

## 2015-12-17 DIAGNOSIS — N644 Mastodynia: Secondary | ICD-10-CM | POA: Diagnosis not present

## 2015-12-25 ENCOUNTER — Ambulatory Visit (INDEPENDENT_AMBULATORY_CARE_PROVIDER_SITE_OTHER): Payer: Medicare Other | Admitting: Neurology

## 2015-12-25 ENCOUNTER — Encounter: Payer: Self-pay | Admitting: Neurology

## 2015-12-25 VITALS — BP 126/60 | HR 68 | Ht 66.5 in | Wt 156.0 lb

## 2015-12-25 DIAGNOSIS — F32A Depression, unspecified: Secondary | ICD-10-CM

## 2015-12-25 DIAGNOSIS — F329 Major depressive disorder, single episode, unspecified: Secondary | ICD-10-CM | POA: Diagnosis not present

## 2015-12-25 DIAGNOSIS — F411 Generalized anxiety disorder: Secondary | ICD-10-CM

## 2015-12-25 DIAGNOSIS — R413 Other amnesia: Secondary | ICD-10-CM | POA: Diagnosis not present
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
## 2015-12-31 DIAGNOSIS — I10 Essential (primary) hypertension: Secondary | ICD-10-CM | POA: Diagnosis not present

## 2015-12-31 DIAGNOSIS — K610 Anal abscess: Secondary | ICD-10-CM | POA: Diagnosis not present

## 2015-12-31 DIAGNOSIS — F324 Major depressive disorder, single episode, in partial remission: Secondary | ICD-10-CM | POA: Diagnosis not present

## 2016-01-01 ENCOUNTER — Telehealth: Payer: Self-pay | Admitting: Neurology

## 2016-01-01 DIAGNOSIS — F329 Major depressive disorder, single episode, unspecified: Secondary | ICD-10-CM

## 2016-01-01 DIAGNOSIS — F419 Anxiety disorder, unspecified: Secondary | ICD-10-CM

## 2016-01-01 DIAGNOSIS — F32A Depression, unspecified: Secondary | ICD-10-CM

## 2016-01-01 NOTE — Telephone Encounter (Signed)
PT called and said she needed a referral from Dr Delice Lesch for a psychiatrist/Dawn CB# [phone]

## 2016-01-02 NOTE — Telephone Encounter (Signed)
Returned call. Explained to patient that Dr. Delice Lesch wanted her to have psychiatry & counseling. The office that she was referred to only does therapy. I did cancel that order to [facility] and will send referral to [facility] in [place] on [address] Rockey Situ patient that office will call her with an appt.

## 2016-01-07 ENCOUNTER — Ambulatory Visit: Payer: Medicare Other | Admitting: Neurology

## 2016-01-11 IMAGING — MR MR HEAD WO/W CM
11 of 12 series · 46 of 48 positions shown · IV contrast (multihance)
Comparison: None.

CLINICAL DATA: Syncopal episodes. Severe posterior headaches for 4
months. Double vision. Difficulty walking and some confusion.

EXAM:
MRI HEAD WITHOUT AND WITH CONTRAST
TECHNIQUE: Multiplanar, multiecho pulse sequences of the brain and surrounding
structures were obtained without and with intravenous contrast.
CONTRAST:  13mL MULTIHANCE GADOBENATE DIMEGLUMINE 529 MG/ML IV SOLN

[Series 2: T1 · sagittal · 5.0mm · 0.45mm/px · 2 of 21 slices shown]
[im 1/21]
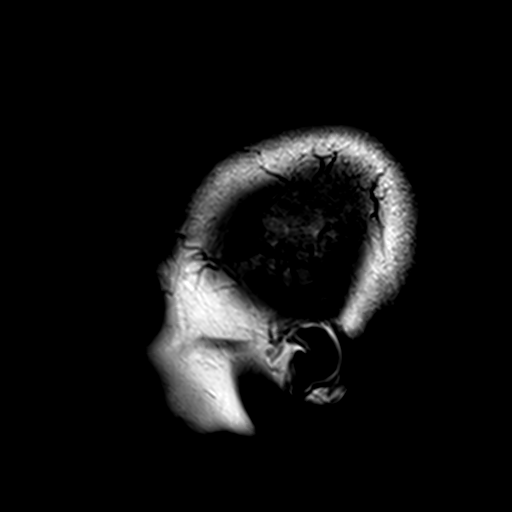
[im 21/21]
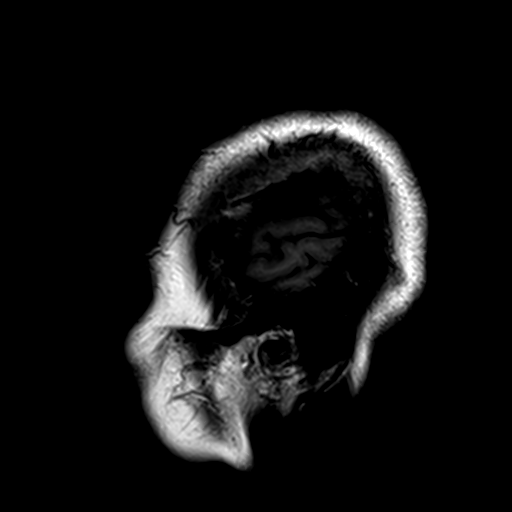

[Series 3: DWI · axial · 3.0mm · 1.80mm/px · z∈[-61,+85]mm · 8 of 97 slices shown (1 of 4)]
[im 1/97]
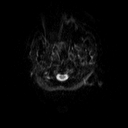
[im 14/97]
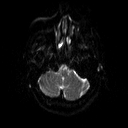
[im 28/97]
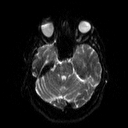
[im 42/97]
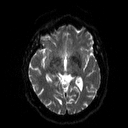
[im 55/97]
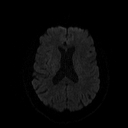
[im 69/97]
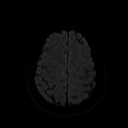
[im 83/97]
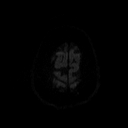
[im 97/97]
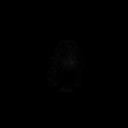

[Series 4: DWI · axial · 3.0mm · 1.80mm/px · z∈[-61,+85]mm · 4 of 50 slices shown (2 of 4)]
[im 1/50]
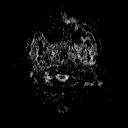
[im 17/50]
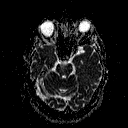
[im 33/50]
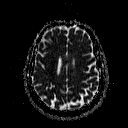
[im 50/50]
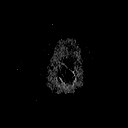

[Series 6: swi_images · axial · 2.0mm · 0.90mm/px · z∈[-66,+91]mm · 6 of 80 slices shown]
[im 1/80]
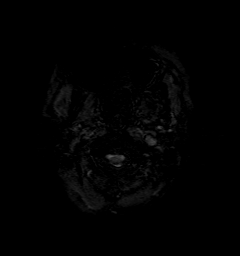
[im 16/80]
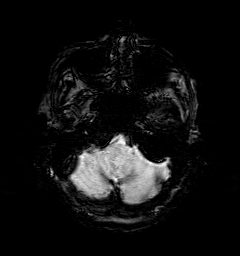
[im 32/80]
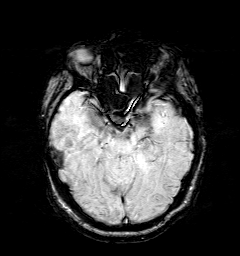
[im 48/80]
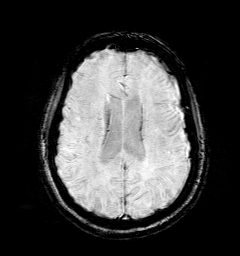
[im 64/80]
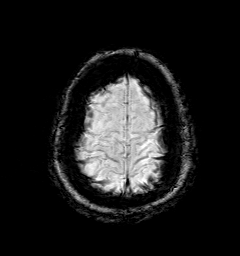
[im 80/80]
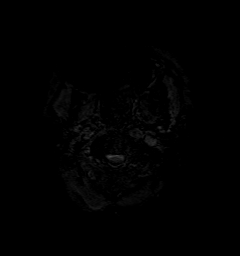

[Series 7: DWI · coronal · 5.0mm · 1.80mm/px · 5 of 65 slices shown (3 of 4)]
[im 1/65]
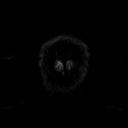
[im 17/65]
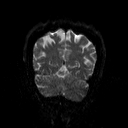
[im 33/65]
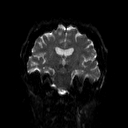
[im 49/65]
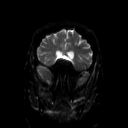
[im 65/65]
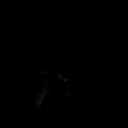

[Series 8: DWI · coronal · 5.0mm · 1.80mm/px · 3 of 34 slices shown (4 of 4)]
[im 1/34]
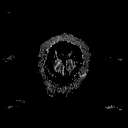
[im 17/34]
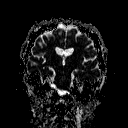
[im 34/34]
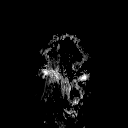

[Series 9: T2 · axial · 5.0mm · 0.51mm/px · z∈[-64,+77]mm · 2 of 22 slices shown (1 of 2)]
[im 1/22]
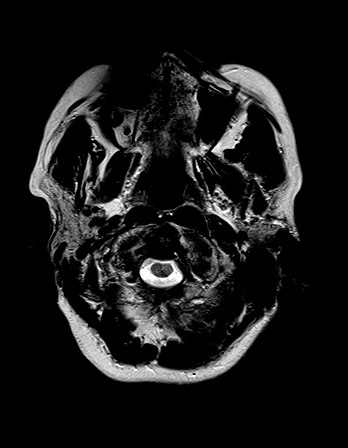
[im 22/22]
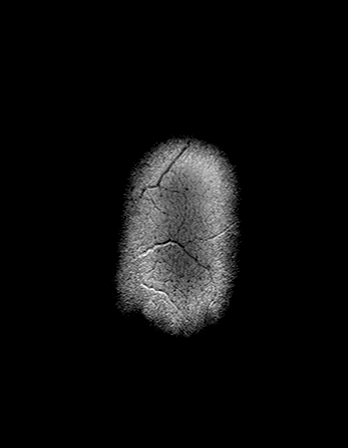

[Series 10: FLAIR · axial · 5.0mm · 0.45mm/px · z∈[-65,+76]mm · 2 of 22 slices shown]
[im 1/22]
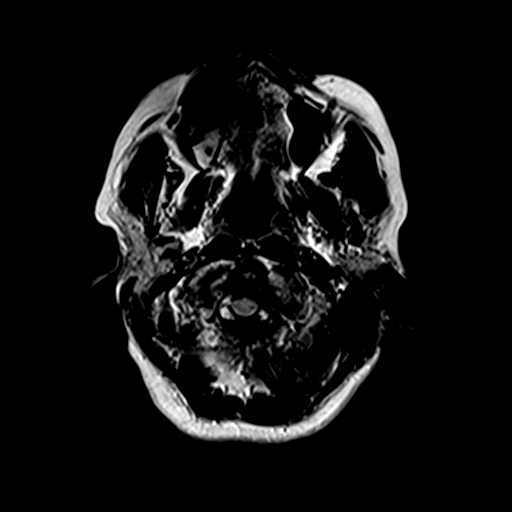
[im 22/22]
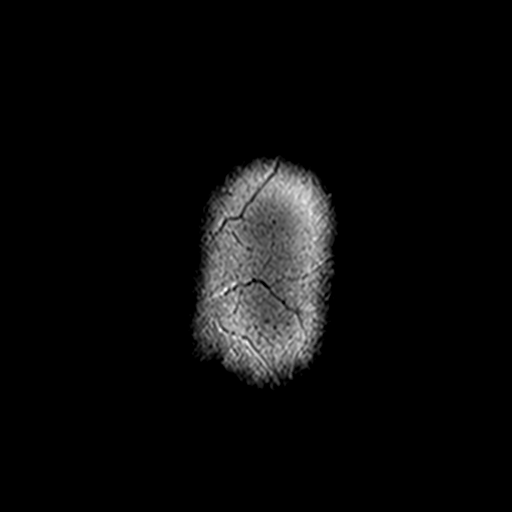

[Series 12: t1_mpr_tra · axial · 2.0mm · 0.45mm/px · z∈[-18,+140]mm · 6 of 80 slices shown (1 of 2)]
[im 1/80]
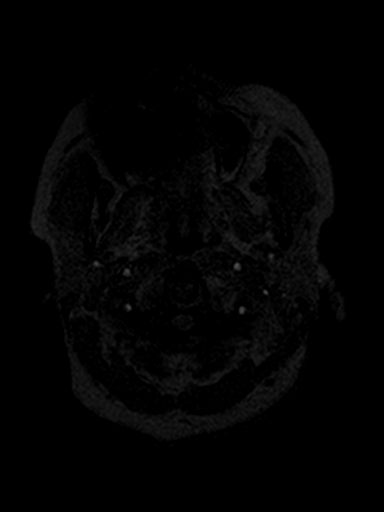
[im 16/80]
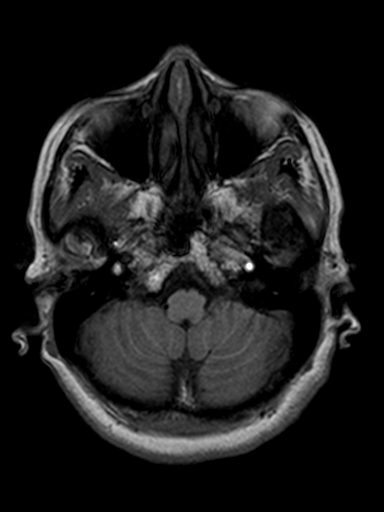
[im 32/80]
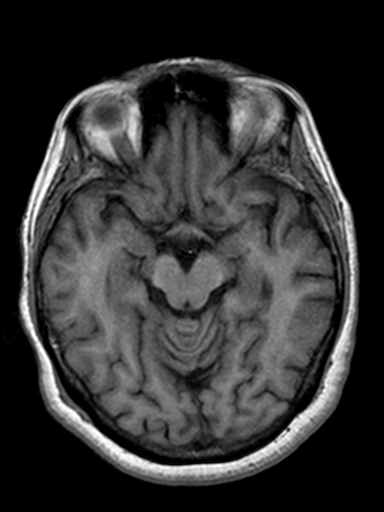
[im 48/80]
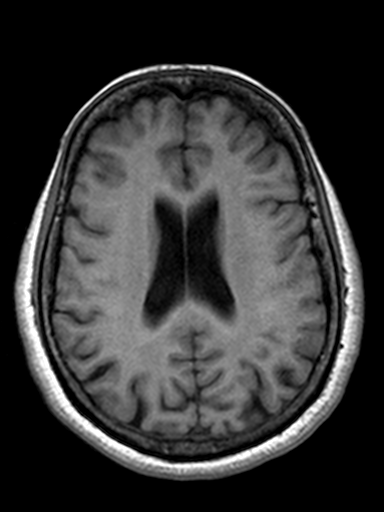
[im 64/80]
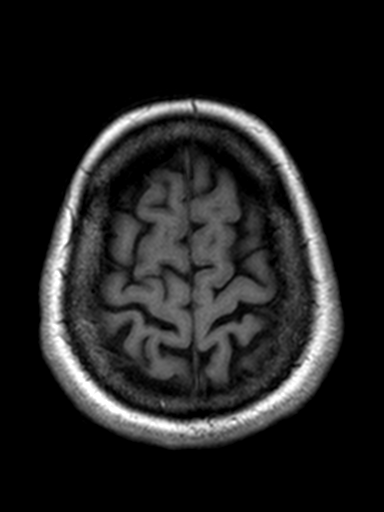
[im 80/80]
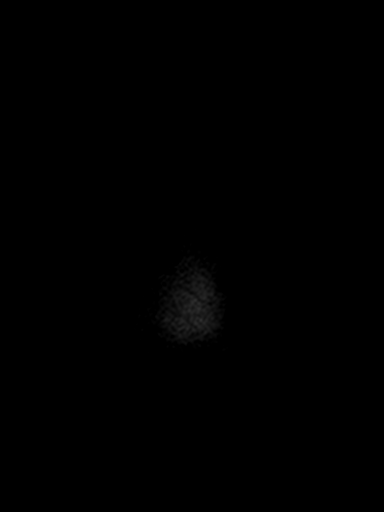

[Series 13: T2 · coronal · 5.0mm · 0.45mm/px · 2 of 28 slices shown (2 of 2)]
[im 1/28]
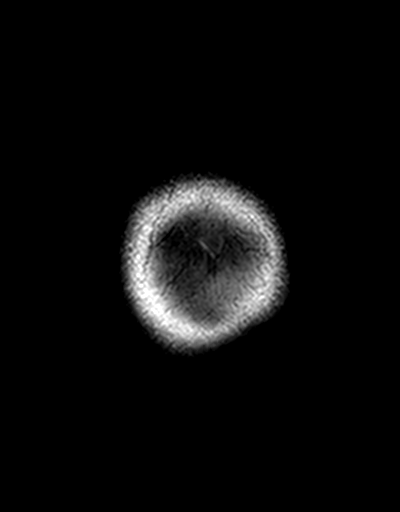
[im 28/28]
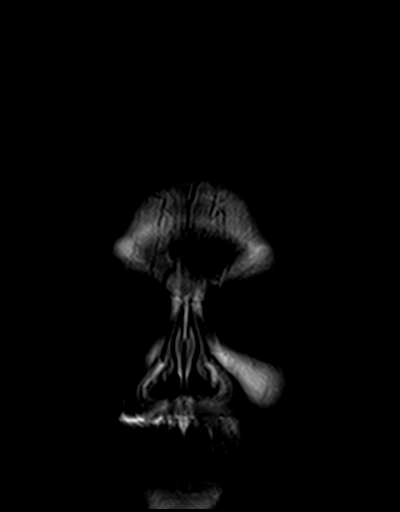

[Series 14: t1_mpr_tra · axial · 2.0mm · 0.45mm/px · z∈[-17,+141]mm · 6 of 80 slices shown (2 of 2)]
[im 1/80]
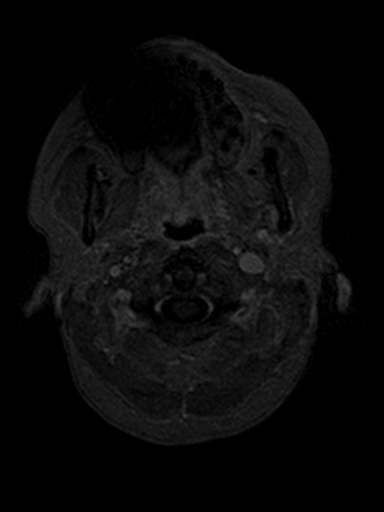
[im 16/80]
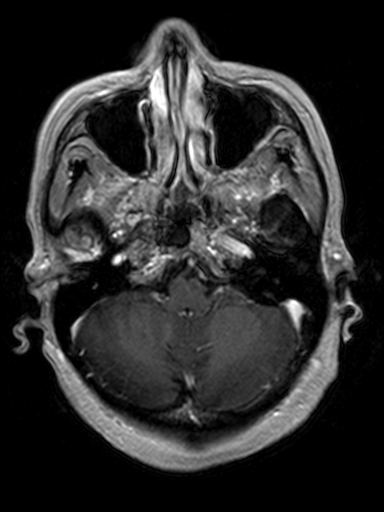
[im 32/80]
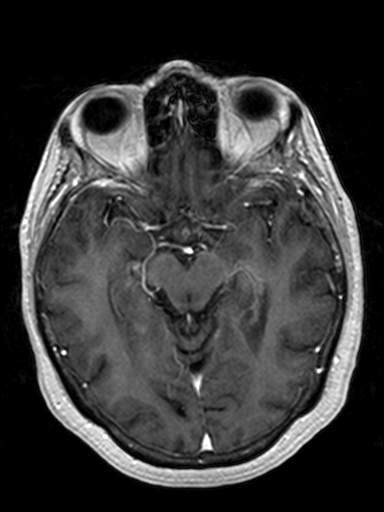
[im 48/80]
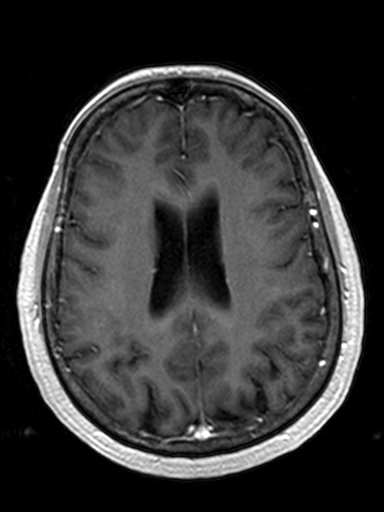
[im 64/80]
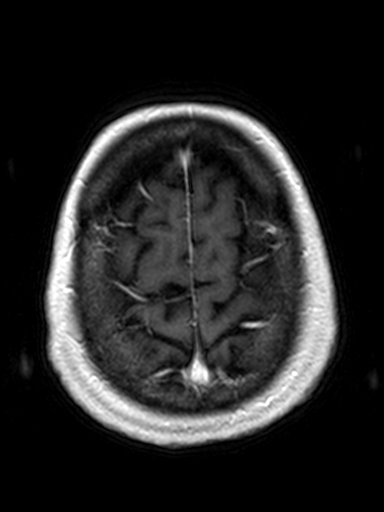
[im 80/80]
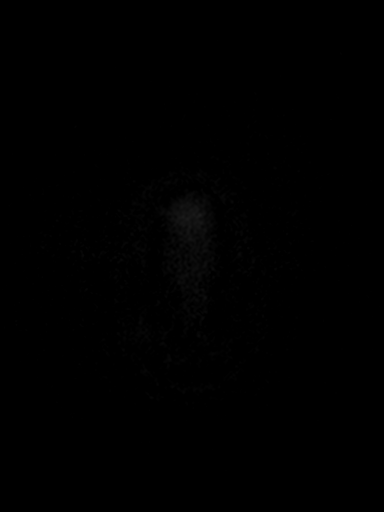

[46 of 48 positions shown; findings below may reference images not displayed]

FINDINGS: There is no evidence of acute infarct, intracranial hemorrhage,
mass, midline shift, or extra-axial fluid collection. Mild
generalized cerebral atrophy is within normal limits for age. No
significant cerebral white matter disease is seen. There is mild
patchy T2 hyperintensity in the pons. No abnormal enhancement is
identified.

Prior bilateral cataract extraction is noted. Paranasal sinuses and
mastoid air cells are clear. Major intracranial vascular flow voids
are preserved. Right transverse and right sigmoid sinuses are
hypoplastic.
IMPRESSION: 1. No acute intracranial abnormality or mass.
2. Mild nonspecific T2 hyperintensity in the pons, possibly sequelae
of chronic small vessel ischemia.

## 2016-01-20 DIAGNOSIS — N950 Postmenopausal bleeding: Secondary | ICD-10-CM | POA: Diagnosis not present

## 2016-01-20 DIAGNOSIS — R102 Pelvic and perineal pain: Secondary | ICD-10-CM | POA: Diagnosis not present

## 2016-01-29 DIAGNOSIS — N950 Postmenopausal bleeding: Secondary | ICD-10-CM | POA: Diagnosis not present

## 2016-02-06 ENCOUNTER — Encounter: Payer: Self-pay | Admitting: Family Medicine

## 2016-02-14 DIAGNOSIS — Z01419 Encounter for gynecological examination (general) (routine) without abnormal findings: Secondary | ICD-10-CM | POA: Diagnosis not present

## 2016-02-14 DIAGNOSIS — Z124 Encounter for screening for malignant neoplasm of cervix: Secondary | ICD-10-CM | POA: Diagnosis not present

## 2016-03-18 ENCOUNTER — Encounter: Payer: Self-pay | Admitting: Family Medicine

## 2016-05-26 ENCOUNTER — Ambulatory Visit: Payer: Medicare Other | Admitting: Neurology

## 2016-06-03 ENCOUNTER — Ambulatory Visit: Payer: Medicare Other | Admitting: Neurology

## 2016-07-10 DIAGNOSIS — Z23 Encounter for immunization: Secondary | ICD-10-CM | POA: Diagnosis not present

## 2016-07-10 DIAGNOSIS — K219 Gastro-esophageal reflux disease without esophagitis: Secondary | ICD-10-CM | POA: Diagnosis not present

## 2016-07-10 DIAGNOSIS — E559 Vitamin D deficiency, unspecified: Secondary | ICD-10-CM | POA: Diagnosis not present

## 2016-07-10 DIAGNOSIS — A09 Infectious gastroenteritis and colitis, unspecified: Secondary | ICD-10-CM | POA: Diagnosis not present

## 2016-07-10 DIAGNOSIS — I10 Essential (primary) hypertension: Secondary | ICD-10-CM | POA: Diagnosis not present

## 2016-07-10 DIAGNOSIS — E7800 Pure hypercholesterolemia, unspecified: Secondary | ICD-10-CM | POA: Diagnosis not present

## 2016-07-10 DIAGNOSIS — G454 Transient global amnesia: Secondary | ICD-10-CM | POA: Diagnosis not present

## 2016-07-10 DIAGNOSIS — Z79899 Other long term (current) drug therapy: Secondary | ICD-10-CM | POA: Diagnosis not present

## 2016-07-10 DIAGNOSIS — F321 Major depressive disorder, single episode, moderate: Secondary | ICD-10-CM | POA: Diagnosis not present

## 2016-07-17 DIAGNOSIS — A09 Infectious gastroenteritis and colitis, unspecified: Secondary | ICD-10-CM | POA: Diagnosis not present

## 2016-07-22 DIAGNOSIS — K219 Gastro-esophageal reflux disease without esophagitis: Secondary | ICD-10-CM | POA: Diagnosis not present

## 2016-07-22 DIAGNOSIS — R109 Unspecified abdominal pain: Secondary | ICD-10-CM | POA: Diagnosis not present

## 2016-07-22 DIAGNOSIS — R188 Other ascites: Secondary | ICD-10-CM | POA: Diagnosis not present

## 2016-08-25 DIAGNOSIS — F321 Major depressive disorder, single episode, moderate: Secondary | ICD-10-CM | POA: Diagnosis not present

## 2016-08-25 DIAGNOSIS — Z79899 Other long term (current) drug therapy: Secondary | ICD-10-CM | POA: Diagnosis not present

## 2016-08-25 DIAGNOSIS — I10 Essential (primary) hypertension: Secondary | ICD-10-CM | POA: Diagnosis not present

## 2016-08-25 DIAGNOSIS — K219 Gastro-esophageal reflux disease without esophagitis: Secondary | ICD-10-CM | POA: Diagnosis not present

## 2016-08-25 DIAGNOSIS — K529 Noninfective gastroenteritis and colitis, unspecified: Secondary | ICD-10-CM | POA: Diagnosis not present

## 2016-10-08 DIAGNOSIS — F321 Major depressive disorder, single episode, moderate: Secondary | ICD-10-CM | POA: Diagnosis not present

## 2016-10-08 DIAGNOSIS — Z79899 Other long term (current) drug therapy: Secondary | ICD-10-CM | POA: Diagnosis not present

## 2016-10-08 DIAGNOSIS — K219 Gastro-esophageal reflux disease without esophagitis: Secondary | ICD-10-CM | POA: Diagnosis not present

## 2016-10-08 DIAGNOSIS — R739 Hyperglycemia, unspecified: Secondary | ICD-10-CM | POA: Diagnosis not present

## 2016-10-08 DIAGNOSIS — I10 Essential (primary) hypertension: Secondary | ICD-10-CM | POA: Diagnosis not present

## 2016-10-08 DIAGNOSIS — A6000 Herpesviral infection of urogenital system, unspecified: Secondary | ICD-10-CM | POA: Diagnosis not present

## 2016-10-09 DIAGNOSIS — E119 Type 2 diabetes mellitus without complications: Secondary | ICD-10-CM | POA: Diagnosis not present

## 2016-10-26 DIAGNOSIS — F321 Major depressive disorder, single episode, moderate: Secondary | ICD-10-CM | POA: Diagnosis not present

## 2016-10-26 DIAGNOSIS — I10 Essential (primary) hypertension: Secondary | ICD-10-CM | POA: Diagnosis not present

## 2016-10-26 DIAGNOSIS — Z79899 Other long term (current) drug therapy: Secondary | ICD-10-CM | POA: Diagnosis not present

## 2016-10-26 DIAGNOSIS — K219 Gastro-esophageal reflux disease without esophagitis: Secondary | ICD-10-CM | POA: Diagnosis not present

## 2016-11-19 DIAGNOSIS — R197 Diarrhea, unspecified: Secondary | ICD-10-CM | POA: Diagnosis not present

## 2016-11-19 DIAGNOSIS — R159 Full incontinence of feces: Secondary | ICD-10-CM | POA: Diagnosis not present

## 2016-12-17 DIAGNOSIS — T148XXA Other injury of unspecified body region, initial encounter: Secondary | ICD-10-CM | POA: Diagnosis not present

## 2016-12-17 DIAGNOSIS — R42 Dizziness and giddiness: Secondary | ICD-10-CM | POA: Diagnosis not present

## 2016-12-17 DIAGNOSIS — Z23 Encounter for immunization: Secondary | ICD-10-CM | POA: Diagnosis not present

## 2016-12-17 DIAGNOSIS — S0990XA Unspecified injury of head, initial encounter: Secondary | ICD-10-CM | POA: Diagnosis not present

## 2016-12-17 DIAGNOSIS — R4189 Other symptoms and signs involving cognitive functions and awareness: Secondary | ICD-10-CM | POA: Diagnosis not present

## 2016-12-17 DIAGNOSIS — R079 Chest pain, unspecified: Secondary | ICD-10-CM | POA: Diagnosis not present

## 2016-12-17 DIAGNOSIS — Z883 Allergy status to other anti-infective agents status: Secondary | ICD-10-CM | POA: Diagnosis not present

## 2016-12-17 DIAGNOSIS — H6123 Impacted cerumen, bilateral: Secondary | ICD-10-CM | POA: Diagnosis not present

## 2016-12-17 DIAGNOSIS — Z7982 Long term (current) use of aspirin: Secondary | ICD-10-CM | POA: Diagnosis not present

## 2016-12-17 DIAGNOSIS — I10 Essential (primary) hypertension: Secondary | ICD-10-CM | POA: Diagnosis not present

## 2016-12-17 DIAGNOSIS — S01511A Laceration without foreign body of lip, initial encounter: Secondary | ICD-10-CM | POA: Diagnosis not present

## 2016-12-17 DIAGNOSIS — W0110XA Fall on same level from slipping, tripping and stumbling with subsequent striking against unspecified object, initial encounter: Secondary | ICD-10-CM | POA: Diagnosis not present

## 2016-12-17 DIAGNOSIS — Z043 Encounter for examination and observation following other accident: Secondary | ICD-10-CM | POA: Diagnosis not present

## 2016-12-17 DIAGNOSIS — S0993XA Unspecified injury of face, initial encounter: Secondary | ICD-10-CM | POA: Diagnosis not present

## 2016-12-17 DIAGNOSIS — Z880 Allergy status to penicillin: Secondary | ICD-10-CM | POA: Diagnosis not present

## 2016-12-17 DIAGNOSIS — Y9301 Activity, walking, marching and hiking: Secondary | ICD-10-CM | POA: Diagnosis not present

## 2016-12-17 DIAGNOSIS — Z885 Allergy status to narcotic agent status: Secondary | ICD-10-CM | POA: Diagnosis not present

## 2016-12-17 DIAGNOSIS — W1839XA Other fall on same level, initial encounter: Secondary | ICD-10-CM | POA: Diagnosis not present

## 2016-12-18 DIAGNOSIS — E7800 Pure hypercholesterolemia, unspecified: Secondary | ICD-10-CM | POA: Diagnosis not present

## 2016-12-18 DIAGNOSIS — K219 Gastro-esophageal reflux disease without esophagitis: Secondary | ICD-10-CM | POA: Diagnosis not present

## 2016-12-18 DIAGNOSIS — F321 Major depressive disorder, single episode, moderate: Secondary | ICD-10-CM | POA: Diagnosis not present

## 2016-12-18 DIAGNOSIS — R002 Palpitations: Secondary | ICD-10-CM | POA: Diagnosis not present

## 2016-12-18 DIAGNOSIS — Z79899 Other long term (current) drug therapy: Secondary | ICD-10-CM | POA: Diagnosis not present

## 2016-12-18 DIAGNOSIS — R51 Headache: Secondary | ICD-10-CM | POA: Diagnosis not present

## 2016-12-18 DIAGNOSIS — R42 Dizziness and giddiness: Secondary | ICD-10-CM | POA: Diagnosis not present

## 2017-05-27 DIAGNOSIS — I10 Essential (primary) hypertension: Secondary | ICD-10-CM | POA: Diagnosis not present

## 2017-05-27 DIAGNOSIS — K219 Gastro-esophageal reflux disease without esophagitis: Secondary | ICD-10-CM | POA: Diagnosis not present

## 2017-05-27 DIAGNOSIS — H6123 Impacted cerumen, bilateral: Secondary | ICD-10-CM | POA: Diagnosis not present

## 2017-05-27 DIAGNOSIS — E7800 Pure hypercholesterolemia, unspecified: Secondary | ICD-10-CM | POA: Diagnosis not present

## 2017-05-27 DIAGNOSIS — F321 Major depressive disorder, single episode, moderate: Secondary | ICD-10-CM | POA: Diagnosis not present

## 2017-05-27 DIAGNOSIS — Z79899 Other long term (current) drug therapy: Secondary | ICD-10-CM | POA: Diagnosis not present

## 2017-06-09 DIAGNOSIS — H6123 Impacted cerumen, bilateral: Secondary | ICD-10-CM | POA: Diagnosis not present

## 2017-08-18 DIAGNOSIS — F329 Major depressive disorder, single episode, unspecified: Secondary | ICD-10-CM | POA: Diagnosis not present

## 2017-08-18 DIAGNOSIS — F319 Bipolar disorder, unspecified: Secondary | ICD-10-CM | POA: Diagnosis not present

## 2017-08-18 DIAGNOSIS — R195 Other fecal abnormalities: Secondary | ICD-10-CM | POA: Diagnosis not present

## 2017-08-18 DIAGNOSIS — F419 Anxiety disorder, unspecified: Secondary | ICD-10-CM | POA: Diagnosis not present

## 2017-08-18 DIAGNOSIS — R159 Full incontinence of feces: Secondary | ICD-10-CM | POA: Diagnosis not present

## 2017-08-18 DIAGNOSIS — R7303 Prediabetes: Secondary | ICD-10-CM | POA: Diagnosis not present

## 2017-08-19 DIAGNOSIS — L218 Other seborrheic dermatitis: Secondary | ICD-10-CM | POA: Diagnosis not present

## 2017-08-19 DIAGNOSIS — L02821 Furuncle of head [any part, except face]: Secondary | ICD-10-CM | POA: Diagnosis not present
